# Patient Record
Sex: Female | Born: 1965 | Hispanic: Yes | Marital: Single | Smoking: Never smoker
Health system: Southern US, Community
[De-identification: ages and names within clinical notes are randomized; demographics above are authoritative.]

## PROBLEM LIST (undated history)

## (undated) DIAGNOSIS — E119 Type 2 diabetes mellitus without complications: Secondary | ICD-10-CM

---

## 2016-09-12 ENCOUNTER — Emergency Department (HOSPITAL_COMMUNITY): Payer: Self-pay

## 2016-09-12 ENCOUNTER — Emergency Department (HOSPITAL_COMMUNITY)
Admission: EM | Admit: 2016-09-12 | Discharge: 2016-09-12 | Disposition: A | Discharge status: Home / Self Care | Payer: Self-pay | Attending: Emergency Medicine | Admitting: Emergency Medicine

## 2016-09-12 ENCOUNTER — Encounter (HOSPITAL_COMMUNITY): Payer: Self-pay | Admitting: Emergency Medicine

## 2016-09-12 DIAGNOSIS — J0100 Acute maxillary sinusitis, unspecified: Secondary | ICD-10-CM | POA: Insufficient documentation

## 2016-09-12 DIAGNOSIS — R404 Transient alteration of awareness: Secondary | ICD-10-CM

## 2016-09-12 DIAGNOSIS — R791 Abnormal coagulation profile: Secondary | ICD-10-CM | POA: Insufficient documentation

## 2016-09-12 DIAGNOSIS — E119 Type 2 diabetes mellitus without complications: Secondary | ICD-10-CM | POA: Insufficient documentation

## 2016-09-12 DIAGNOSIS — Z7984 Long term (current) use of oral hypoglycemic drugs: Secondary | ICD-10-CM | POA: Insufficient documentation

## 2016-09-12 DIAGNOSIS — R42 Dizziness and giddiness: Secondary | ICD-10-CM

## 2016-09-12 HISTORY — DX: Type 2 diabetes mellitus without complications: E11.9

## 2016-09-12 LAB — I-STAT CHEM 8, ED
BUN: 11 mg/dL (ref 6–20)
CHLORIDE: 103 mmol/L (ref 101–111)
CREATININE: 0.7 mg/dL (ref 0.44–1.00)
Calcium, Ion: 1.1 mmol/L — ABNORMAL LOW (ref 1.15–1.40)
Glucose, Bld: 185 mg/dL — ABNORMAL HIGH (ref 65–99)
HEMATOCRIT: 45 % (ref 36.0–46.0)
Hemoglobin: 15.3 g/dL — ABNORMAL HIGH (ref 12.0–15.0)
POTASSIUM: 3.7 mmol/L (ref 3.5–5.1)
Sodium: 139 mmol/L (ref 135–145)
TCO2: 24 mmol/L (ref 0–100)

## 2016-09-12 LAB — COMPREHENSIVE METABOLIC PANEL
ALT: 26 U/L (ref 14–54)
AST: 25 U/L (ref 15–41)
Albumin: 4.3 g/dL (ref 3.5–5.0)
Alkaline Phosphatase: 89 U/L (ref 38–126)
Anion gap: 11 (ref 5–15)
BUN: 10 mg/dL (ref 6–20)
CHLORIDE: 105 mmol/L (ref 101–111)
CO2: 22 mmol/L (ref 22–32)
Calcium: 9.6 mg/dL (ref 8.9–10.3)
Creatinine, Ser: 0.74 mg/dL (ref 0.44–1.00)
GLUCOSE: 185 mg/dL — AB (ref 65–99)
Potassium: 3.8 mmol/L (ref 3.5–5.1)
Sodium: 138 mmol/L (ref 135–145)
TOTAL PROTEIN: 7.6 g/dL (ref 6.5–8.1)
Total Bilirubin: 0.9 mg/dL (ref 0.3–1.2)

## 2016-09-12 LAB — DIFFERENTIAL
BASOS PCT: 0 %
Basophils Absolute: 0 10*3/uL (ref 0.0–0.1)
EOS ABS: 0.2 10*3/uL (ref 0.0–0.7)
Eosinophils Relative: 3 %
Lymphocytes Relative: 25 %
Lymphs Abs: 2.3 10*3/uL (ref 0.7–4.0)
Monocytes Absolute: 0.6 10*3/uL (ref 0.1–1.0)
Monocytes Relative: 6 %
NEUTROS ABS: 6.2 10*3/uL (ref 1.7–7.7)
Neutrophils Relative %: 66 %

## 2016-09-12 LAB — I-STAT TROPONIN, ED: Troponin i, poc: 0 ng/mL (ref 0.00–0.08)

## 2016-09-12 LAB — CBC
HEMATOCRIT: 42.1 % (ref 36.0–46.0)
Hemoglobin: 14.6 g/dL (ref 12.0–15.0)
MCH: 30.3 pg (ref 26.0–34.0)
MCHC: 34.7 g/dL (ref 30.0–36.0)
MCV: 87.3 fL (ref 78.0–100.0)
Platelets: 204 10*3/uL (ref 150–400)
RBC: 4.82 MIL/uL (ref 3.87–5.11)
RDW: 13 % (ref 11.5–15.5)
WBC: 9.3 10*3/uL (ref 4.0–10.5)

## 2016-09-12 LAB — PROTIME-INR
INR: 1
Prothrombin Time: 13.2 seconds (ref 11.4–15.2)

## 2016-09-12 LAB — APTT: aPTT: 27 seconds (ref 24–36)

## 2016-09-12 LAB — CBG MONITORING, ED: GLUCOSE-CAPILLARY: 175 mg/dL — AB (ref 65–99)

## 2016-09-12 MED ORDER — HYDROXYZINE HCL 25 MG PO TABS: 25.0000 mg | ORAL_TABLET | Freq: Three times a day (TID) | ORAL | 0 refills | Status: AC | PRN

## 2016-09-12 MED ORDER — LORAZEPAM 2 MG/ML IJ SOLN
1.0000 mg | Freq: Once | INTRAMUSCULAR | Status: AC
  Administered 2016-09-12: 1 mg via INTRAVENOUS
  Filled 2016-09-12: qty 1

## 2016-09-12 MED ORDER — LORAZEPAM 2 MG/ML IJ SOLN
1.0000 mg | Freq: Once | INTRAMUSCULAR | Status: AC
  Administered 2016-09-12: 1 mg via INTRAVENOUS

## 2016-09-12 MED ORDER — LORAZEPAM 2 MG/ML IJ SOLN
INTRAMUSCULAR | Status: AC
  Filled 2016-09-12: qty 1

## 2016-09-12 MED ORDER — AMOXICILLIN-POT CLAVULANATE 875-125 MG PO TABS: 1.0000 | ORAL_TABLET | Freq: Two times a day (BID) | ORAL | 0 refills | Status: AC

## 2016-09-12 NOTE — ED Provider Notes (Signed)
[facility]-EMERGENCY DEPT Provider Note   CSN: [id number] Arrival date & time: 09/12/16  0820     History   Chief Complaint Chief Complaint  Patient presents with  . Code Stroke  . Dizziness    HPI Jennifer Steele is a 50 y.o. female.  HPI 50 year old female with past medical history of diabetes who presents with transient memory loss. The patient has reportedly been under significant stress at home. She has had multiple "issues" with family members and friends. Just prior to arrival, the patient was working on a machine when she became "dizzy" and started repetitively questioning things. Per report, she was repetitively talking about "tea and echiladas." She has since returned to her normal state. She states that she felt lightheaded and anxious prior to the episode. She needs to feel anxious but no longer confused. Denies any headache. Denies any recent falls or head trauma. No focal numbness or weakness. No difficulty speaking at this time.  Past Medical History:  Diagnosis Date  . Diabetes mellitus without complication (HCC)     There are no active problems to display for this patient.   History reviewed. No pertinent surgical history.  OB History    No data available       Home Medications    Prior to Admission medications   Medication Sig Start Date End Date Taking? Authorizing Provider  acetaminophen (TYLENOL) 325 MG tablet Take 650 mg by mouth every 6 (six) hours as needed for mild pain.   Yes Historical Provider, MD  metFORMIN (GLUCOPHAGE) 500 MG tablet Take 500 mg by mouth daily with breakfast.   Yes Historical Provider, MD    Family History No family history on file.  Social History Social History  Substance Use Topics  . Smoking status: Never Smoker  . Smokeless tobacco: Never Used  . Alcohol use Not on file     Allergies   Review of patient's allergies indicates not on file.   Review of Systems Review of Systems  Constitutional: Positive  for fatigue. Negative for chills and fever.  HENT: Negative for congestion, rhinorrhea and sore throat.   Eyes: Negative for visual disturbance.  Respiratory: Negative for cough, shortness of breath and wheezing.   Cardiovascular: Negative for chest pain and leg swelling.  Gastrointestinal: Negative for abdominal pain, diarrhea, nausea and vomiting.  Genitourinary: Negative for dysuria, flank pain, vaginal bleeding and vaginal discharge.  Musculoskeletal: Negative for neck pain.  Skin: Negative for rash.  Allergic/Immunologic: Negative for immunocompromised state.  Neurological: Positive for speech difficulty (Transient, now resolved) and light-headedness. Negative for syncope and headaches.  Hematological: Does not bruise/bleed easily.  Psychiatric/Behavioral: The patient is nervous/anxious.   All other systems reviewed and are negative.    Physical Exam Updated Vital Signs BP 168/86   Pulse 70   Temp 98.1 F (36.7 C)   Resp 16   Ht 5' (1.524 m)   Wt 236 lb 5.3 oz (107.2 kg)   SpO2 99%   BMI 46.16 kg/m   Physical Exam  Constitutional: She is oriented to person, place, and time. She appears well-developed and well-nourished. No distress.  HENT:  Head: Normocephalic and atraumatic.  Eyes: Conjunctivae are normal.  Neck: Neck supple.  Cardiovascular: Normal rate, regular rhythm and normal heart sounds.  Exam reveals no friction rub.   No murmur heard. Pulmonary/Chest: Effort normal and breath sounds normal. No respiratory distress. She has no wheezes. She has no rales.  Abdominal: She exhibits no distension.  Musculoskeletal: She  exhibits no edema.  Neurological: She is alert and oriented to person, place, and time. She exhibits normal muscle tone.  Skin: Skin is warm. Capillary refill takes less than 2 seconds.  Psychiatric: She has a normal mood and affect.  Nursing note and vitals reviewed.   Neurological Exam:  Mental Status: Alert and oriented to person, place, and  time. Attention and concentration normal. Speech clear. Recent memory is intact. Cranial Nerves: Visual fields intact to confrontation in all quadrants bilaterally. EOMI and PERRLA. No nystagmus noted. Facial sensation intact at forehead, maxillary cheek, and chin/mandible bilaterally. No weakness of masticatory muscles. No facial asymmetry or weakness. Hearing grossly normal to finer rub. Uvula is midline, and palate elevates symmetrically. Normal SCM and trapezius strength. Tongue midline without fasciculations Motor: Muscle strength 5/5 in proximal and distal UE and LE bilaterally. No pronator drift. Muscle tone normal. Reflexes: 2+ and symmetrical in all four extremities.  Sensation: Intact to light touch in upper and lower extremities distally bilaterally.  Gait: Normal without ataxia. Coordination: Normal FTN bilaterally.    ED Treatments / Results  Labs (all labs ordered are listed, but only abnormal results are displayed) Labs Reviewed  COMPREHENSIVE METABOLIC PANEL - Abnormal; Notable for the following:       Result Value   Glucose, Bld 185 (*)    All other components within normal limits  CBG MONITORING, ED - Abnormal; Notable for the following:    Glucose-Capillary 175 (*)    All other components within normal limits  I-STAT CHEM 8, ED - Abnormal; Notable for the following:    Glucose, Bld 185 (*)    Calcium, Ion 1.10 (*)    Hemoglobin 15.3 (*)    All other components within normal limits  PROTIME-INR  APTT  CBC  DIFFERENTIAL  I-STAT TROPOININ, ED  CBG MONITORING, ED    EKG  EKG Interpretation None       Radiology Mr Brain Wo Contrast  Result Date: 09/12/2016 CLINICAL DATA:  Code stroke for dizziness. EXAM: MRI HEAD WITHOUT CONTRAST TECHNIQUE: Multiplanar, multiecho pulse sequences of the brain and surrounding structures were obtained without intravenous contrast. COMPARISON:  Head CT from earlier today FINDINGS: Brain: No acute infarction, hemorrhage,  hydrocephalus, extra-axial collection or mass lesion. Cortically based gliosis and encephalomalacia in the inferior right frontal lobe and right temporal pole. No evidence of demyelination or significant chronic microvascular disease. Vascular: Normal flow voids. Skull and upper cervical spine: Mildly hypo intense cervical marrow signal without focal lesion. Sinuses/Orbits: Patchy inflammatory mucosal thickening, extensive in the left maxillary sinus. Minor mucosal thickening in the mastoid air cells. IMPRESSION: 1. No acute finding. 2. 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 3. Advanced left maxillary sinusitis. Electronically Signed   By: Marnee Spring M.D.   On: 09/12/2016 14:53   Ct Head Code Stroke W/o Cm  Result Date: 09/12/2016 CLINICAL DATA:  Code stroke.  Speech disturbance beginning today. EXAM: CT HEAD WITHOUT CONTRAST TECHNIQUE: Contiguous axial images were obtained from the base of the skull through the vertex without intravenous contrast. COMPARISON:  None. FINDINGS: Brain: Normal appearance without evidence of malformation, atrophy, old or acute infarction, mass lesion, hemorrhage, hydrocephalus or extra-axial collection. Vascular: There is atherosclerotic calcification of the major vessels at the base of the brain. No evidence of hyperdense vessel thrombosis. Skull: Normal Sinuses/Orbits: Clear/normal Other: None ASPECTS (Alberta Stroke Program Early CT Score) - Ganglionic level infarction (caudate, lentiform nuclei, internal capsule, insula, M1-M3 cortex): 7 - Supraganglionic  infarction (M4-M6 cortex): 3 Total score (0-10 with 10 being normal): 10 IMPRESSION: 1. Normal head CT. 2. ASPECTS is 10 These results were called by telephone at the time of interpretation on 09/12/2016 at 8:42 am to Dr. Amada JupiterKirkpatrick, who verbally acknowledged these results. Electronically Signed   By: Paulina FusiMark  Shogry M.D.   On: 09/12/2016 08:45    Procedures Procedures  (including critical care time)  Medications Ordered in ED Medications  LORazepam (ATIVAN) 2 MG/ML injection (not administered)  LORazepam (ATIVAN) injection 1 mg (1 mg Intravenous Given 09/12/16 1218)  LORazepam (ATIVAN) injection 1 mg (1 mg Intravenous Given 09/12/16 1414)     Initial Impression / Assessment and Plan / ED Course  I have reviewed the triage vital signs and the nursing notes.  Pertinent labs & imaging results that were available during my care of the patient were reviewed by me and considered in my medical decision making (see chart for details).  Clinical Course    50 year old female with past medical history of anxiety and diabetes who presents with transient anxiety, confusion, and possible memory loss. Patient was initially activated as a code stroke and route and was taken to the CT scanner with neurology. CT head shows no acute abnormality. Dr. Amada JupiterKirkpatrick of neurology has evaluated and has a low suspicion for stroke. He suspects possible orthostasis, anxiety, or less likely partial complex seizure. He recommends MRI but feels the patient can likely be discharged if it shows no evidence of acute stroke. Does not recommend any imaging of the carotids. Otherwise, will check screening labs and reassess. No drug use. Patient does not appear intoxicated and is now back to baseline.  MRI shows acute sinusitis but is otherwise unremarkable. She does have evidence of old trauma which is known. Her symptoms remain resolved. Labwork shows no acute infectious abnormality. EKG is nonischemic. Discussed with Dr. Amada JupiterKirkpatrick. Will discharge with treatment for sinus infection and outpatient referral for possible EEG. No seizure-like activity noticed here. Patient is admitted toward without difficulty. Symptoms remain resolved. No evidence of acute neurological emergency.  Final Clinical Impressions(s) / ED Diagnoses   Final diagnoses:  Acute non-recurrent maxillary sinusitis    Dizziness      Shaune Pollackameron Sierra Spargo, MD 09/12/16 1645

## 2016-09-12 NOTE — ED Notes (Signed)
Took patient off of bedpan; Erma HeritageIsaacs, MD present in room using translator monitor

## 2016-09-12 NOTE — Consult Note (Signed)
Neurology Consultation Reason for Consult: Concern for stroke Referring Physician: Erma HeritageIsaacs, C  CC: Transient episode of confusion  History is obtained from: Patient  HPI: Jennifer Steele is a 50 y.o. female with a history of diabetes who has had a transient episode of memory loss. She states that she was working on a machine, but then became "dizzy" and then has a period of amnesia followed by being in a different location. Witnesses at the scene report that she was repetitively talking about "tea and enchiladas." She since has returned to her normal state.  I discussed with family, no history of seizures, staring spells, or other transient episodes of concern.  LKW: 7 AM tpa given?: no, return to baseline   ROS: A 14 point ROS was performed and is negative except as noted in the HPI.   Past Medical History:  Diagnosis Date  . Diabetes mellitus without complication (HCC)      FHx:  No hx similar   Social History:  reports that she has never smoked. She has never used smokeless tobacco. Her alcohol and drug histories are not on file.   Exam: Current vital signs: BP 179/100   Pulse 85   Resp 20   Ht 5' (1.524 m)   Wt 107.2 kg (236 lb 5.3 oz)   SpO2 99%   BMI 46.16 kg/m  Vital signs in last 24 hours: Pulse Rate:  [85] 85 (10/11 0845) Resp:  [16-20] 20 (10/11 0845) BP: (172-179)/(80-100) 179/100 (10/11 0845) SpO2:  [98 %-99 %] 99 % (10/11 0845) Weight:  [107.2 kg (236 lb 5.3 oz)] 107.2 kg (236 lb 5.3 oz) (10/11 16100841)   Physical Exam  Constitutional: Appears well-developed and well-nourished.  Psych: Affect appropriate to situation Eyes: No scleral injection HENT: No OP obstrucion Head: Normocephalic.  Cardiovascular: Normal rate and regular rhythm.  Respiratory: Effort normal and breath sounds normal to anterior ascultation GI: Soft.  No distension. There is no tenderness.  Skin: WDI  Neuro: Mental Status: Patient is awake, alert, oriented to person,  place, month, year, and situation. Patient is able to give a clear and coherent history. No signs of aphasia or neglect Cranial Nerves: II: Visual Fields are full. Pupils are equal, round, and reactive to light.   III,IV, VI: EOMI without ptosis or diploplia.  V: Facial sensation is symmetric to temperature VII: Facial movement is symmetric.  VIII: hearing is intact to voice X: Uvula elevates symmetrically XI: Shoulder shrug is symmetric. XII: tongue is midline without atrophy or fasciculations.  Motor: Tone is normal. Bulk is normal. 5/5 strength was present in all four extremities.  Sensory: Sensation is symmetric to light touch and temperature in the arms and legs. Deep Tendon Reflexes: 2+ and symmetric in the biceps and patellae.  Plantars: Toes are downgoing bilaterally.  Cerebellar: FNF intact bilaterally   I have reviewed labs in epic and the results pertinent to this consultation are: CMP-unremarkable  I have reviewed the images obtained: CT head-negative  Impression: 50 year old female with amnestic period during which she had repetitive speech. Her description of dizziness associated with the episode could've been an aura, or this could've represented presyncope. This time, given the amnesia associated with the event and repetitive speech without clear aphasia, I think that TIA is much less likely.  I do think an MRI would be reasonable, but this is negative then I don't think that further TIA workup would need to be undertaken. An EEG could be performed as an outpatient.  Recommendations: 1) MRI brain 2) she will need an EEG, though this could be performed as an outpatient.   Ritta Slot, MD [facility] [phone]  If 7pm- 7am, please page neurology on call as listed in AMION.

## 2016-09-12 NOTE — ED Notes (Signed)
Patient has returned from CT scan; Myself and Joselyn Glassmanyler, EMT undressed patient, placed on monitor, continuous pulse oximetry and blood pressure cuff

## 2016-09-12 NOTE — ED Triage Notes (Addendum)
[facility] with code stroke called for repetitive speech and confusion. Pt spanish-speaking, on EMS arrival, pt was talking repetitively about "enchiladas and tea". Family states pt was more confused than normal. Pt states via hospital translator, she has been experiencing dizziness, weakness and period of memory loss today. Pt reporting headache and ringing in ears as well. Moves all extremities equally, follows commands

## 2016-09-12 NOTE — Progress Notes (Signed)
Patient admitted as Code Stroke at 26.  Patient has a history of diabetes who has had a transient episode of memory loss. She states that she was working on a machine, but then became "dizzy" and then has a period of amnesia followed by being in a different location. Witnesses at the scene report that she was repetitively talking about "tea and enchiladas." She since has returned to her normal state. Patient did complain of HA on and off during the EMS transport per EMS.   Patient examined by team with the use of translator and patient neuro status was back to baseline. NIH was 0, MRI was ordered. MRI - for stroke, patient discharged

## 2016-09-12 NOTE — ED Notes (Signed)
Pt taken to MRI  

## 2016-09-14 ENCOUNTER — Encounter (HOSPITAL_BASED_OUTPATIENT_CLINIC_OR_DEPARTMENT_OTHER): Payer: Self-pay

## 2017-09-24 IMAGING — MR MR HEAD W/O CM
9 of 11 series · 32 of 48 positions shown · non-contrast
Comparison: Head CT from earlier today

CLINICAL DATA: Code stroke for dizziness.

EXAM:
MRI HEAD WITHOUT CONTRAST
TECHNIQUE: Multiplanar, multiecho pulse sequences of the brain and surrounding
structures were obtained without intravenous contrast.

[Series 3: DWI · axial · 3.0mm · 0.94mm/px · z∈[-91,+35]mm · 6 of 86 slices shown (1 of 2)]
[im 1/86]
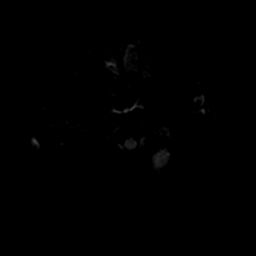
[im 18/86]
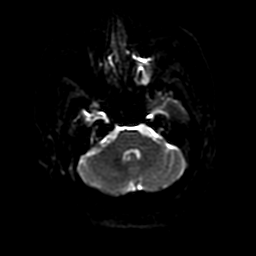
[im 35/86]
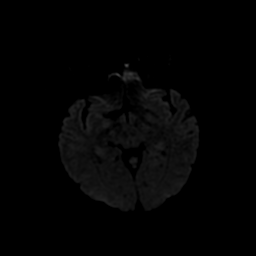
[im 52/86]
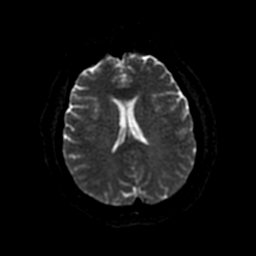
[im 69/86]
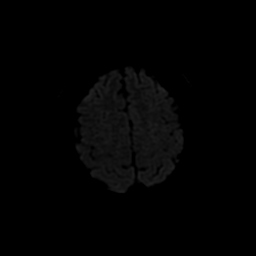
[im 86/86]
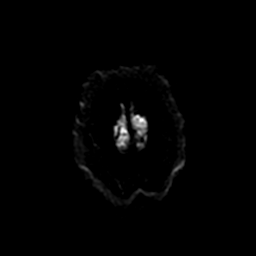

[Series 4: FLAIR · sagittal · 5.0mm · 0.47mm/px · 2 of 23 slices shown (1 of 2)]
[im 1/23]
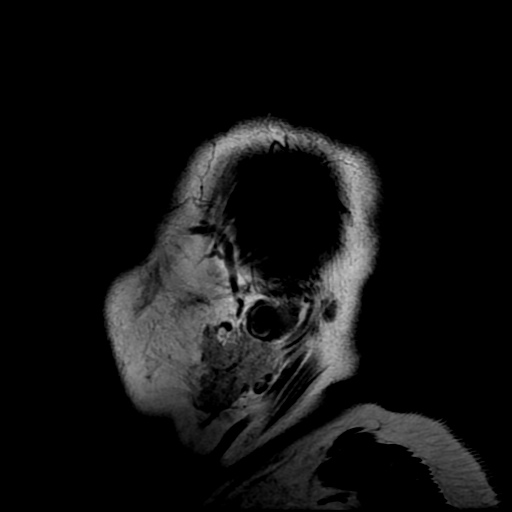
[im 23/23]
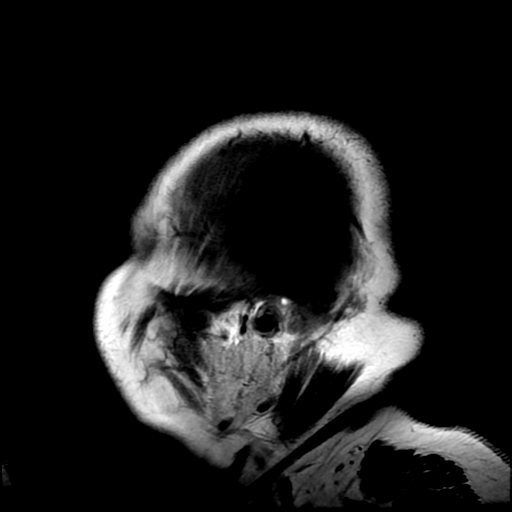

[Series 6: T2 · axial · 5.0mm · 0.47mm/px · z∈[-97,+35]mm · 2 of 23 slices shown (1 of 2)]
[im 1/23]
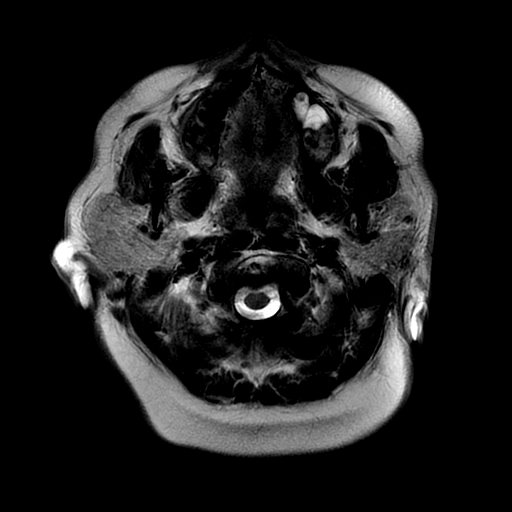
[im 23/23]
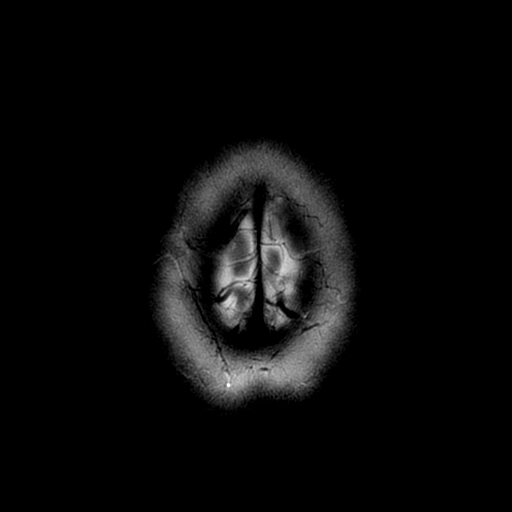

[Series 7: FLAIR · axial · 5.0mm · 0.94mm/px · z∈[-97,+35]mm · 2 of 23 slices shown (2 of 2)]
[im 1/23]
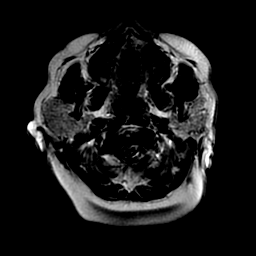
[im 23/23]
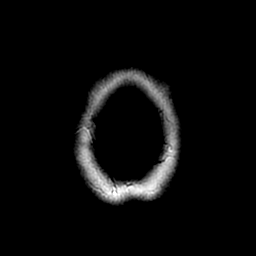

[Series 8: DWI · coronal · 4.0mm · 0.94mm/px · 5 of 58 slices shown (2 of 2)]
[im 1/58]
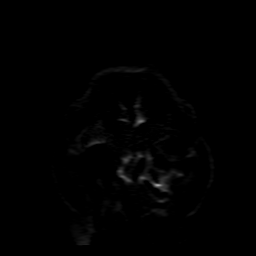
[im 15/58]
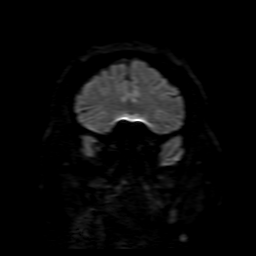
[im 29/58]
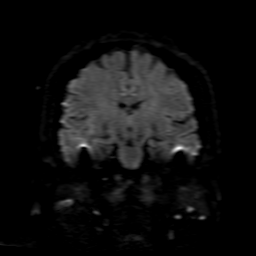
[im 43/58]
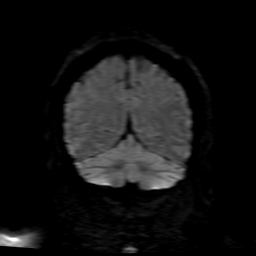
[im 58/58]
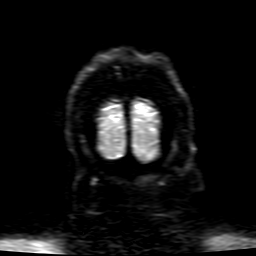

[Series 9: (person_name) · axial · 3.0mm · 0.47mm/px · z∈[-98,+19]mm · 7 of 92 slices shown]
[im 1/92]
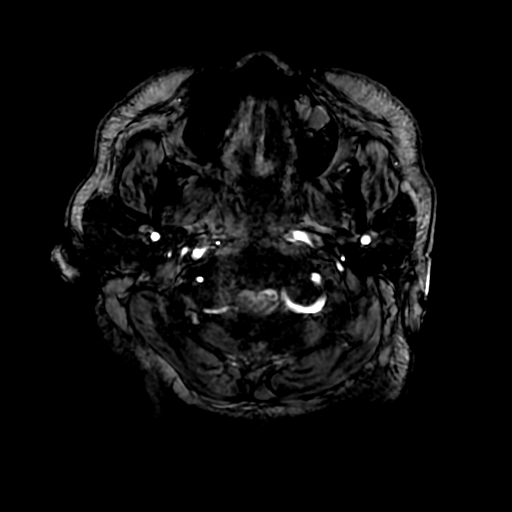
[im 14/92]
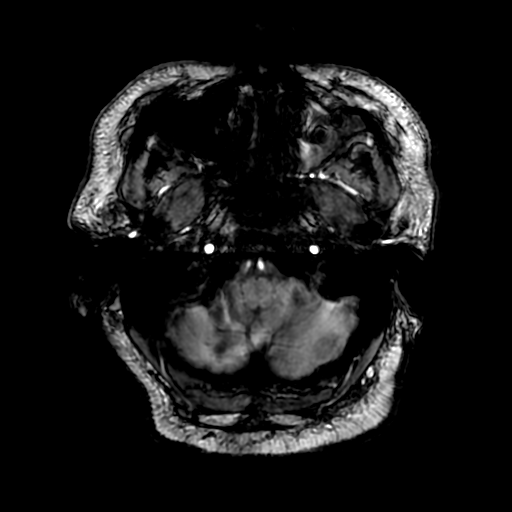
[im 27/92]
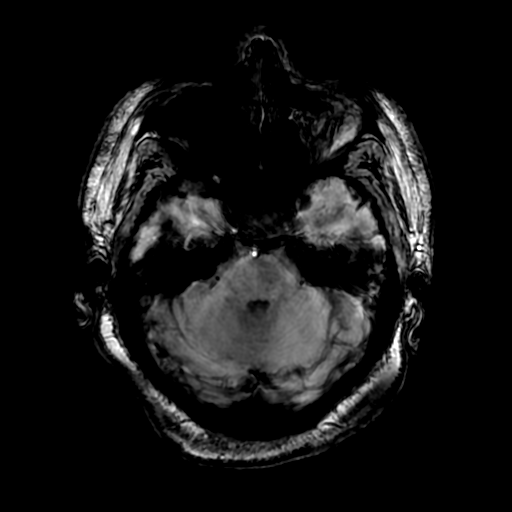
[im 40/92]
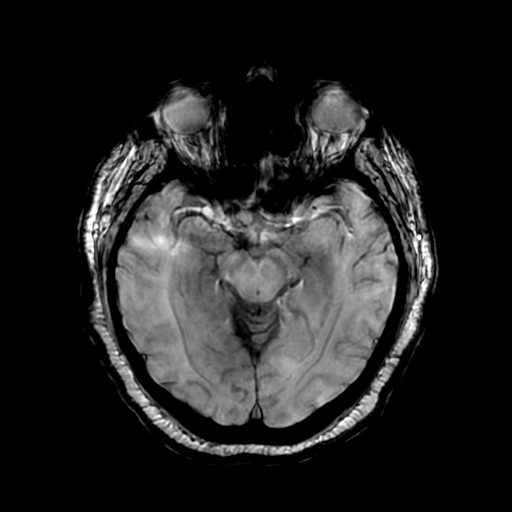
[im 53/92]
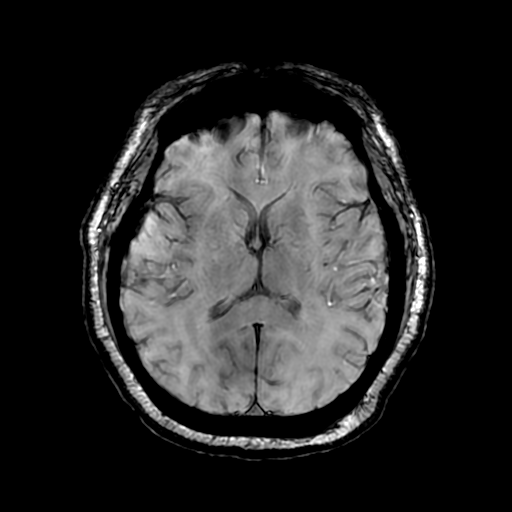
[im 66/92]
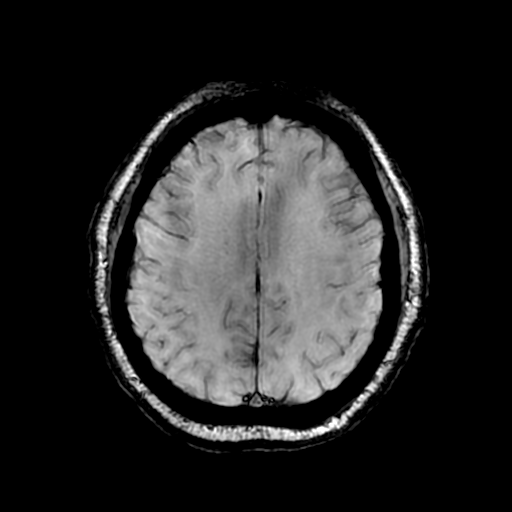
[im 79/92]
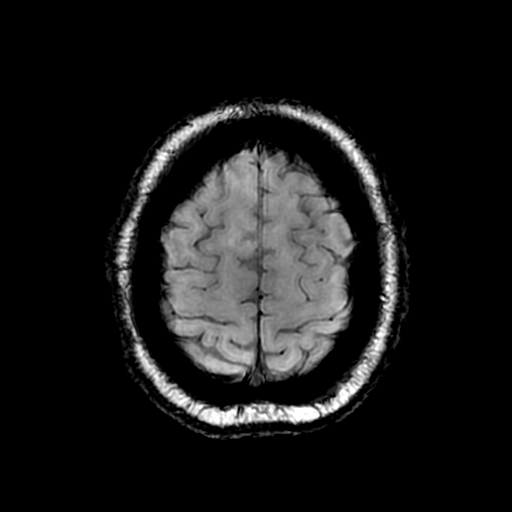

[Series 11: T2 · coronal · 5.0mm · 0.47mm/px · 2 of 24 slices shown (2 of 2)]
[im 1/24]
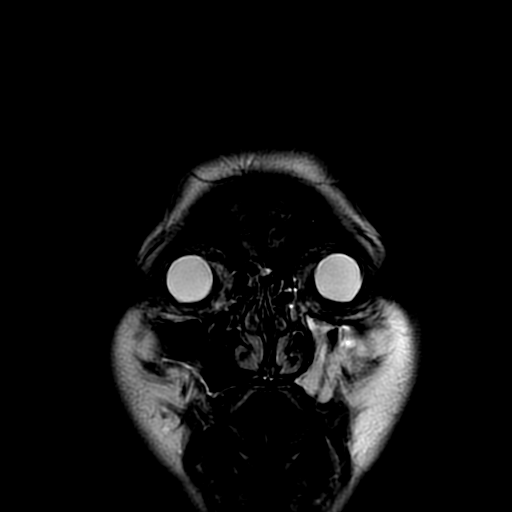
[im 24/24]
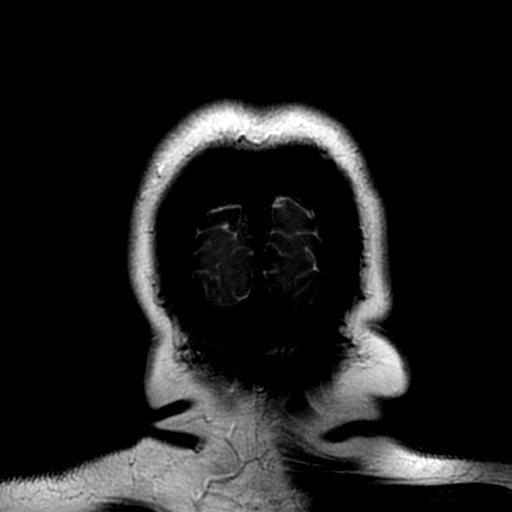

[Series 350: ADC · axial · 3.0mm · 0.94mm/px · z∈[-91,+35]mm · 4 of 42 slices shown (1 of 2)]
[im 1/42]
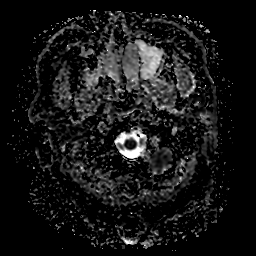
[im 14/42]
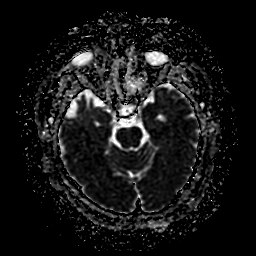
[im 28/42]
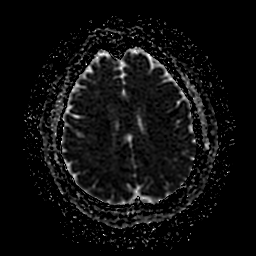
[im 42/42]
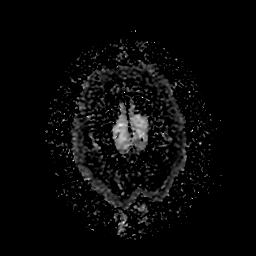

[Series 850: ADC · coronal · 4.0mm · 0.94mm/px · 2 of 29 slices shown (2 of 2)]
[im 1/29]
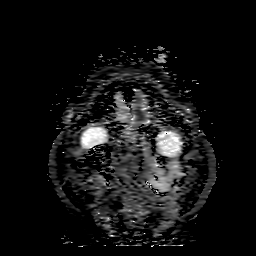
[im 29/29]
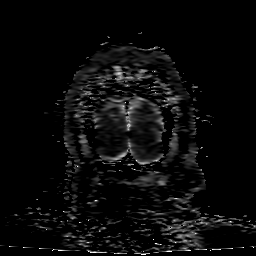

[32 of 48 positions shown; findings below may reference images not displayed]

FINDINGS: Brain: No acute infarction, hemorrhage, hydrocephalus, extra-axial
collection or mass lesion.

Cortically based gliosis and encephalomalacia in the inferior right
frontal lobe and right temporal pole.

No evidence of demyelination or significant chronic microvascular
disease.

Vascular: Normal flow voids.

Skull and upper cervical spine: Mildly hypo intense cervical marrow
signal without focal lesion.

Sinuses/Orbits: Patchy inflammatory mucosal thickening, extensive in
the left maxillary sinus. Minor mucosal thickening in the mastoid
air cells.
IMPRESSION: 1. No acute finding.
2. Right temporal pole and inferior frontal encephalomalacia, a
posttraumatic pattern.
3. Advanced left maxillary sinusitis.
